# Patient Record
Sex: Female | Born: 1975 | Hispanic: No | Marital: Single | Smoking: Never smoker
Health system: Southern US, Community
[De-identification: ages and names within clinical notes are randomized; demographics above are authoritative.]

## PROBLEM LIST (undated history)

## (undated) DIAGNOSIS — M1990 Unspecified osteoarthritis, unspecified site: Secondary | ICD-10-CM

## (undated) DIAGNOSIS — J45909 Unspecified asthma, uncomplicated: Secondary | ICD-10-CM

## (undated) HISTORY — DX: Unspecified asthma, uncomplicated: J45.909

---

## 2004-10-19 ENCOUNTER — Other Ambulatory Visit: Admission: RE | Admit: 2004-10-19 | Discharge: 2004-10-19 | Payer: Self-pay | Admitting: Obstetrics and Gynecology

## 2005-11-13 ENCOUNTER — Other Ambulatory Visit: Admission: RE | Admit: 2005-11-13 | Discharge: 2005-11-13 | Payer: Self-pay | Admitting: Obstetrics and Gynecology

## 2007-01-09 ENCOUNTER — Other Ambulatory Visit: Admission: RE | Admit: 2007-01-09 | Discharge: 2007-01-09 | Payer: Self-pay | Admitting: Obstetrics and Gynecology

## 2008-02-03 ENCOUNTER — Other Ambulatory Visit: Admission: RE | Admit: 2008-02-03 | Discharge: 2008-02-03 | Payer: Self-pay | Admitting: Obstetrics and Gynecology

## 2009-02-10 ENCOUNTER — Other Ambulatory Visit: Admission: RE | Admit: 2009-02-10 | Discharge: 2009-02-10 | Payer: Self-pay | Admitting: Obstetrics and Gynecology

## 2010-04-11 ENCOUNTER — Other Ambulatory Visit: Admission: RE | Admit: 2010-04-11 | Discharge: 2010-04-11 | Payer: Self-pay | Admitting: Obstetrics and Gynecology

## 2013-08-06 ENCOUNTER — Other Ambulatory Visit: Payer: Self-pay | Admitting: Nurse Practitioner

## 2013-08-06 ENCOUNTER — Other Ambulatory Visit (HOSPITAL_COMMUNITY)
Admission: RE | Admit: 2013-08-06 | Discharge: 2013-08-06 | Disposition: A | Discharge status: Home / Self Care | Payer: 59 | Source: Ambulatory Visit | Attending: Nurse Practitioner | Admitting: Nurse Practitioner

## 2013-08-06 DIAGNOSIS — Z1151 Encounter for screening for human papillomavirus (HPV): Secondary | ICD-10-CM | POA: Insufficient documentation

## 2013-08-06 DIAGNOSIS — Z01419 Encounter for gynecological examination (general) (routine) without abnormal findings: Secondary | ICD-10-CM | POA: Insufficient documentation

## 2014-04-17 ENCOUNTER — Encounter (HOSPITAL_COMMUNITY): Payer: Self-pay | Admitting: Emergency Medicine

## 2014-04-17 ENCOUNTER — Emergency Department (HOSPITAL_COMMUNITY): Payer: 59

## 2014-04-17 ENCOUNTER — Emergency Department (HOSPITAL_COMMUNITY)
Admission: EM | Admit: 2014-04-17 | Discharge: 2014-04-18 | Disposition: A | Discharge status: Home / Self Care | Payer: 59 | Attending: Emergency Medicine | Admitting: Emergency Medicine

## 2014-04-17 DIAGNOSIS — Z880 Allergy status to penicillin: Secondary | ICD-10-CM | POA: Insufficient documentation

## 2014-04-17 DIAGNOSIS — Y92009 Unspecified place in unspecified non-institutional (private) residence as the place of occurrence of the external cause: Secondary | ICD-10-CM | POA: Diagnosis not present

## 2014-04-17 DIAGNOSIS — S4980XA Other specified injuries of shoulder and upper arm, unspecified arm, initial encounter: Secondary | ICD-10-CM | POA: Diagnosis present

## 2014-04-17 DIAGNOSIS — S59919A Unspecified injury of unspecified forearm, initial encounter: Secondary | ICD-10-CM

## 2014-04-17 DIAGNOSIS — S6990XA Unspecified injury of unspecified wrist, hand and finger(s), initial encounter: Secondary | ICD-10-CM

## 2014-04-17 DIAGNOSIS — Z79899 Other long term (current) drug therapy: Secondary | ICD-10-CM | POA: Diagnosis not present

## 2014-04-17 DIAGNOSIS — M129 Arthropathy, unspecified: Secondary | ICD-10-CM | POA: Insufficient documentation

## 2014-04-17 DIAGNOSIS — S59909A Unspecified injury of unspecified elbow, initial encounter: Secondary | ICD-10-CM | POA: Diagnosis not present

## 2014-04-17 DIAGNOSIS — S52123A Displaced fracture of head of unspecified radius, initial encounter for closed fracture: Secondary | ICD-10-CM | POA: Insufficient documentation

## 2014-04-17 DIAGNOSIS — W010XXA Fall on same level from slipping, tripping and stumbling without subsequent striking against object, initial encounter: Secondary | ICD-10-CM | POA: Insufficient documentation

## 2014-04-17 DIAGNOSIS — S46909A Unspecified injury of unspecified muscle, fascia and tendon at shoulder and upper arm level, unspecified arm, initial encounter: Secondary | ICD-10-CM | POA: Insufficient documentation

## 2014-04-17 DIAGNOSIS — Y9389 Activity, other specified: Secondary | ICD-10-CM | POA: Insufficient documentation

## 2014-04-17 DIAGNOSIS — S52121A Displaced fracture of head of right radius, initial encounter for closed fracture: Secondary | ICD-10-CM

## 2014-04-17 HISTORY — DX: Unspecified osteoarthritis, unspecified site: M19.90

## 2014-04-17 MED ORDER — HYDROCODONE-ACETAMINOPHEN 5-325 MG PO TABS: 1.0000 | ORAL_TABLET | Freq: Four times a day (QID) | ORAL | Status: DC | PRN

## 2014-04-17 MED ORDER — ONDANSETRON HCL 4 MG PO TABS: 4.0000 mg | ORAL_TABLET | Freq: Four times a day (QID) | ORAL | Status: DC

## 2014-04-17 MED ORDER — HYDROCODONE-ACETAMINOPHEN 5-325 MG PO TABS
2.0000 | ORAL_TABLET | Freq: Once | ORAL | Status: AC
  Administered 2014-04-17: 2 via ORAL
  Filled 2014-04-17: qty 2

## 2014-04-17 NOTE — Discharge Instructions (Signed)
Radial Head Fracture °A radial head fracture is a break of the smaller bone (radius) in the forearm. The head of this bone is the part near the elbow. These fractures commonly happen during a fall, when you land on an outstretched arm. These fractures are more common in middle aged adults and are common with a dislocation of the elbow. °SYMPTOMS  °· Swelling of the elbow joint and pain on the outside of the elbow. °· Pain and difficulty in bending or straightening the elbow. °· Pain and difficulty in turning the palm of the hand up or down with the elbow bent. °DIAGNOSIS  °Your caregiver may make this diagnosis by a physical exam. X-rays can confirm the type and amount of fracture. Sometimes a fracture that is not displaced cannot be seen on the original X-ray. °TREATMENT  °Radial head fractures are classified according to the amount of movement (displacement) of parts from the normal position.  °Type 1 Fractures °· Type 1 fractures are generally small fractures in which bone pieces remain together (nondisplaced fracture). °· The fracture may not be seen on initial X-rays. Usually if X-rays are repeated two to three weeks later, the fracture will show up. A splint or sling is used for a few days. Gentle early motion is used to prevent the elbow from becoming stiff. It should not be done vigorously or forced as this could displace the bone pieces. °Type 2 Fractures °· With type 2 fractures, bone pieces are slightly displaced and larger pieces of bone are broken off. °· If only a little displacement of the bone piece is present, splinting for 4 to 5 days usually works well. This is again followed with gentle active range of motion. Small fragments may be surgically removed. °· Large pieces of bone that can be put back into place will sometimes be fixed with pins or screws to hold them until the bone is healed. If this cannot be done, the fragments are removed. For older, less active people, sometimes the entire radial  head is removed if the wrist is not injured. The elbow and arm will still work fine. Soft tissue, tendon, and ligament injuries are corrected at the same time. °Type 3 Fractures °· Type 3 fractures have multiple broken pieces of bone that cannot be fixed. Surgery is usually needed to remove the broken bits of bone and what is left of the radial head. Soft-tissue damage is repaired. Gentle early motion is used to prevent the elbow from becoming stiff. Sometimes an artificial radial head can be used to prevent deformity if the elbow is unstable. °Rest, ice, elevation, immobilization, medications, and pain control are used in the early care. °HOME CARE INSTRUCTIONS  °· Keep the injured part elevated while sitting or lying down. Keep the injury above the level of your heart (the center of the chest). This will decrease swelling and pain. °· Apply ice to the injury for 15-20 minutes, 03-04 times per day while awake, for 2 days. Put the ice in a plastic bag and place a towel between the bag of ice and your cast or splint. °· Move your fingers to avoid stiffness and minimize swelling. °· If you have a plaster or fiberglass cast: °¨ Do not try to scratch the skin under the cast using sharp or pointed objects. °¨ Check the skin around the cast every day. You may put lotion on any red or sore areas. °¨ Keep your cast dry and clean. °· If you have a plaster splint: °¨   Wear the splint as directed. °¨ You may loosen the elastic around the splint if your fingers become numb, tingle, or turn cold or blue. °· Do not put pressure on any part of your cast or splint. It may break. Rest your cast only on a pillow for the first 24 hours until it is fully hardened. °· Your cast or splint can be protected during bathing with a plastic bag. Do not lower the cast or splint into the water. °· Only take over-the-counter or prescription medicines for pain, discomfort, or fever as directed by your caregiver. °· Follow all instructions for  follow-up with your caregiver. This includes any orthopedic referrals, physical therapy, and rehabilitation. Any delay in obtaining necessary care could result in a delay or failure of the bones to heal or permanent elbow stiffness. °· Do not overdo exercises. This could further damage your injury. °SEEK IMMEDIATE MEDICAL CARE IF:  °· Your cast or splint gets damaged or breaks. °· You have more severe pain or swelling than you did before getting the cast. °· You have severe pain when stretching your fingers. °· There is a bad smell, new stains, and/or pus-like (purulent) drainage coming from under the cast. °· Your fingers or hand turn pale or blue, become cold, or you lose feeling. °Document Released: 05/28/2006 Document Revised: 12/21/2013 Document Reviewed: 07/05/2009 °[employer]® Patient Information ©2015 [employer]. This information is not intended to replace advice given to you by your health care provider. Make sure you discuss any questions you have with your health care provider. ° °

## 2014-04-17 NOTE — ED Notes (Signed)
Pt arrived to the ED with a complaint of right arm pain.  Pt was attempting to step over an dog gate when she caught her foot and fell down.  Pt states the pain is located knees and wrist.  Pt has taken an ibuprofen and applied ice.

## 2014-04-17 NOTE — ED Provider Notes (Signed)
CSN: [id number]     Arrival date & time 04/17/14  2114 History   First MD Initiated Contact with Patient 04/17/14 2235     Chief Complaint  Patient presents with  . Arm Pain     (Consider location/radiation/quality/duration/timing/severity/associated sxs/prior Treatment) HPI Comments: Patient presents to the emergency department with chief complaint of right wrist and right elbow pain. She states that she tripped over a dog gate at her house today. She reports moderate to severe pain. It is worsened with movement. She has not taken anything to alleviate her symptoms. She also complains of pain on her right knee, but is able to ambulate, and states that she thinks she is just bruised.  The history is provided by the patient. No language interpreter was used.    Past Medical History  Diagnosis Date  . Arthritis    History reviewed. No pertinent past surgical history. History reviewed. No pertinent family history. History  Substance Use Topics  . Smoking status: Never Smoker   . Smokeless tobacco: Not on file  . Alcohol Use: No   OB History   Grav Para Term Preterm Abortions TAB SAB Ect Mult Living                 Review of Systems  Constitutional: Negative for fever and chills.  Respiratory: Negative for shortness of breath.   Cardiovascular: Negative for chest pain.  Gastrointestinal: Negative for nausea, vomiting, diarrhea and constipation.  Genitourinary: Negative for dysuria.  Musculoskeletal: Positive for arthralgias and joint swelling.      Allergies  Cephalosporins; Erythromycin; and Penicillins  Home Medications   Prior to Admission medications   Medication Sig Start Date End Date Taking? Authorizing Provider  albuterol (PROVENTIL HFA;VENTOLIN HFA) 108 (90 BASE) MCG/ACT inhaler Inhale 2 puffs into the lungs every 4 (four) hours as needed for wheezing or shortness of breath.   Yes Historical Provider, MD  ibuprofen (ADVIL,MOTRIN) 200 MG tablet Take 200 mg by  mouth once as needed.   Yes Historical Provider, MD  Norgestimate-Ethinyl Estradiol Triphasic (TRINESSA, 28,) 0.18/0.215/0.25 MG-35 MCG tablet Take 1 tablet by mouth daily.   Yes Historical Provider, MD   BP 143/84  Pulse 101  Temp(Src) 98.1 F (36.7 C) (Oral)  Resp 16  SpO2 98%  LMP 04/03/2014 Physical Exam  Nursing note and vitals reviewed. Constitutional: She is oriented to person, place, and time. She appears well-developed and well-nourished.  HENT:  Head: Normocephalic and atraumatic.  Eyes: Conjunctivae and EOM are normal.  Neck: Normal range of motion.  Cardiovascular: Normal rate.   Intact distal pulses  Pulmonary/Chest: Effort normal.  Abdominal: She exhibits no distension.  Musculoskeletal: Normal range of motion.  Right wrist and elbow range of motion and strength limited secondary to pain, no bony abnormality or deformity  Neurological: She is alert and oriented to person, place, and time.  Sensation intact  Skin: Skin is dry.  Psychiatric: She has a normal mood and affect. Her behavior is normal. Judgment and thought content normal.    ED Course  Procedures (including critical care time) Labs Review Labs Reviewed - No data to display  Imaging Review Dg Elbow Complete Right  04/17/2014   CLINICAL DATA:  Fall.  Pain.  EXAM: RIGHT ELBOW - COMPLETE 3+ VIEW  COMPARISON:  None.  FINDINGS: A joint effusion is present. There is a deformity of the radial neck suggesting fracture. No other acute fractures are present. The joint is located.  IMPRESSION: 1. Small joint effusion.  2. Deformity at the radial neck compatible with fracture.   Electronically Signed   By: Gennette Pac M.D.   On: 04/17/2014 22:32   Dg Wrist Complete Right  04/17/2014   CLINICAL DATA:  Fall.  Pain.  EXAM: RIGHT WRIST - COMPLETE 3+ VIEW  COMPARISON:  None.  FINDINGS: Right wrist is located. No acute bone or soft tissue abnormalities are present. The carpal bones are intact.  IMPRESSION: Negative  right wrist radiographs.   Electronically Signed   By: Gennette Pac M.D.   On: 04/17/2014 22:30     EKG Interpretation None      MDM   Final diagnoses:  Radial head fracture, closed, right, initial encounter   Patient with fracture of the radial neck, will splint, treat pain, and discharge with orthopedic followup.     Roxy Horseman, PA-C 04/17/14 2344

## 2014-04-20 NOTE — ED Provider Notes (Signed)
Medical screening examination/treatment/procedure(s) were performed by non-physician practitioner and as supervising physician I was immediately available for consultation/collaboration.  Sanela Evola T Trixy Loyola, MD 04/20/14 1732 

## 2016-11-27 ENCOUNTER — Other Ambulatory Visit: Payer: Self-pay | Admitting: Nurse Practitioner

## 2016-11-27 ENCOUNTER — Other Ambulatory Visit (HOSPITAL_COMMUNITY)
Admission: RE | Admit: 2016-11-27 | Discharge: 2016-11-27 | Disposition: A | Discharge status: Home / Self Care | Payer: 59 | Source: Ambulatory Visit | Attending: Nurse Practitioner | Admitting: Nurse Practitioner

## 2016-11-27 DIAGNOSIS — Z1151 Encounter for screening for human papillomavirus (HPV): Secondary | ICD-10-CM | POA: Diagnosis present

## 2016-11-27 DIAGNOSIS — Z01419 Encounter for gynecological examination (general) (routine) without abnormal findings: Secondary | ICD-10-CM | POA: Insufficient documentation

## 2016-12-14 LAB — CYTOLOGY - PAP
DIAGNOSIS: NEGATIVE
HPV: NOT DETECTED

## 2020-06-10 ENCOUNTER — Other Ambulatory Visit: Payer: Self-pay | Admitting: Obstetrics and Gynecology

## 2020-06-10 DIAGNOSIS — Z1231 Encounter for screening mammogram for malignant neoplasm of breast: Secondary | ICD-10-CM

## 2020-07-19 ENCOUNTER — Ambulatory Visit
Admission: RE | Admit: 2020-07-19 | Discharge: 2020-07-19 | Disposition: A | Discharge status: Home / Self Care | Payer: 59 | Source: Ambulatory Visit | Attending: Obstetrics and Gynecology | Admitting: Obstetrics and Gynecology

## 2020-07-19 ENCOUNTER — Other Ambulatory Visit: Payer: Self-pay | Admitting: Obstetrics and Gynecology

## 2020-07-19 ENCOUNTER — Other Ambulatory Visit: Payer: Self-pay

## 2020-07-19 DIAGNOSIS — Z1231 Encounter for screening mammogram for malignant neoplasm of breast: Secondary | ICD-10-CM

## 2020-07-20 ENCOUNTER — Other Ambulatory Visit: Payer: Self-pay | Admitting: Obstetrics and Gynecology

## 2020-07-20 DIAGNOSIS — R928 Other abnormal and inconclusive findings on diagnostic imaging of breast: Secondary | ICD-10-CM

## 2020-07-23 ENCOUNTER — Other Ambulatory Visit: Payer: Self-pay

## 2020-07-23 ENCOUNTER — Ambulatory Visit
Admission: RE | Admit: 2020-07-23 | Discharge: 2020-07-23 | Disposition: A | Discharge status: Home / Self Care | Payer: 59 | Source: Ambulatory Visit | Attending: Obstetrics and Gynecology | Admitting: Obstetrics and Gynecology

## 2020-07-23 DIAGNOSIS — R928 Other abnormal and inconclusive findings on diagnostic imaging of breast: Secondary | ICD-10-CM

## 2020-11-16 ENCOUNTER — Emergency Department (HOSPITAL_BASED_OUTPATIENT_CLINIC_OR_DEPARTMENT_OTHER): Payer: 59

## 2020-11-16 ENCOUNTER — Emergency Department (HOSPITAL_BASED_OUTPATIENT_CLINIC_OR_DEPARTMENT_OTHER)
Admission: EM | Admit: 2020-11-16 | Discharge: 2020-11-16 | Disposition: A | Discharge status: Home / Self Care | Payer: 59 | Attending: Emergency Medicine | Admitting: Emergency Medicine

## 2020-11-16 ENCOUNTER — Other Ambulatory Visit: Payer: Self-pay

## 2020-11-16 ENCOUNTER — Encounter (HOSPITAL_BASED_OUTPATIENT_CLINIC_OR_DEPARTMENT_OTHER): Payer: Self-pay

## 2020-11-16 DIAGNOSIS — M79662 Pain in left lower leg: Secondary | ICD-10-CM | POA: Diagnosis not present

## 2020-11-16 DIAGNOSIS — R52 Pain, unspecified: Secondary | ICD-10-CM

## 2020-11-16 LAB — D-DIMER, QUANTITATIVE: D-Dimer, Quant: <0.27 ug/mL-FEU (ref 0.00–0.50)

## 2020-11-16 LAB — CBC WITH DIFFERENTIAL/PLATELET
Abs Immature Granulocytes: 0.02 10*3/uL (ref 0.00–0.07)
Basophils Absolute: 0 10*3/uL (ref 0.0–0.1)
Basophils Relative: 1 %
Eosinophils Absolute: 0.2 10*3/uL (ref 0.0–0.5)
Eosinophils Relative: 3 %
HCT: 38.9 % (ref 36.0–46.0)
Hemoglobin: 13.4 g/dL (ref 12.0–15.0)
Immature Granulocytes: 0 %
Lymphocytes Relative: 36 %
Lymphs Abs: 2.5 10*3/uL (ref 0.7–4.0)
MCH: 29.8 pg (ref 26.0–34.0)
MCHC: 34.4 g/dL (ref 30.0–36.0)
MCV: 86.4 fL (ref 80.0–100.0)
Monocytes Absolute: 0.5 10*3/uL (ref 0.1–1.0)
Monocytes Relative: 7 %
Neutro Abs: 3.7 10*3/uL (ref 1.7–7.7)
Neutrophils Relative %: 53 %
Platelets: 307 10*3/uL (ref 150–400)
RBC: 4.5 MIL/uL (ref 3.87–5.11)
RDW: 12.6 % (ref 11.5–15.5)
WBC: 6.9 10*3/uL (ref 4.0–10.5)
nRBC: 0 % (ref 0.0–0.2)

## 2020-11-16 LAB — BASIC METABOLIC PANEL
Anion gap: 8 (ref 5–15)
BUN: 11 mg/dL (ref 6–20)
CO2: 21 mmol/L — ABNORMAL LOW (ref 22–32)
Calcium: 6.9 mg/dL — ABNORMAL LOW (ref 8.9–10.3)
Chloride: 110 mmol/L (ref 98–111)
Creatinine, Ser: 0.45 mg/dL (ref 0.44–1.00)
GFR, Estimated: >60 mL/min (ref >60)
Glucose, Bld: 86 mg/dL (ref 70–99)
Potassium: 3.4 mmol/L — ABNORMAL LOW (ref 3.5–5.1)
Sodium: 139 mmol/L (ref 135–145)

## 2020-11-16 LAB — HEPATIC FUNCTION PANEL
ALT: 14 U/L (ref 0–44)
AST: 17 U/L (ref 15–41)
Albumin: 4.5 g/dL (ref 3.5–5.0)
Alkaline Phosphatase: 37 U/L — ABNORMAL LOW (ref 38–126)
Bilirubin, Direct: 0.1 mg/dL (ref 0.0–0.2)
Indirect Bilirubin: 0.8 mg/dL (ref 0.3–0.9)
Total Bilirubin: 0.9 mg/dL (ref 0.3–1.2)
Total Protein: 7.3 g/dL (ref 6.5–8.1)

## 2020-11-16 LAB — VITAMIN D 25 HYDROXY (VIT D DEFICIENCY, FRACTURES): Vit D, 25-Hydroxy: 18.82 ng/mL — ABNORMAL LOW (ref 30–100)

## 2020-11-16 MED ORDER — CALCIUM GLUCONATE-NACL 1-0.675 GM/50ML-% IV SOLN
1.0000 g | Freq: Once | INTRAVENOUS | Status: AC
  Administered 2020-11-16: 1000 mg via INTRAVENOUS
  Filled 2020-11-16: qty 50

## 2020-11-16 MED ORDER — TRAMADOL HCL 50 MG PO TABS: 50.0000 mg | ORAL_TABLET | Freq: Four times a day (QID) | ORAL | 0 refills | Status: DC | PRN

## 2020-11-16 MED ORDER — CYCLOBENZAPRINE HCL 10 MG PO TABS
10.0000 mg | ORAL_TABLET | Freq: Once | ORAL | Status: AC
  Administered 2020-11-16: 10 mg via ORAL
  Filled 2020-11-16: qty 1

## 2020-11-16 MED ORDER — SODIUM CHLORIDE 0.9 % IV SOLN: 1.0000 g | Freq: Once | INTRAVENOUS | Status: DC

## 2020-11-16 MED ORDER — SODIUM CHLORIDE 0.9 % IV BOLUS
1000.0000 mL | Freq: Once | INTRAVENOUS | Status: AC
  Administered 2020-11-16: 1000 mL via INTRAVENOUS

## 2020-11-16 NOTE — ED Triage Notes (Signed)
Patient here POV from Home with Left Leg Pain.   Pain began shortly prior to Midnight tonight and felt like a Cramp; pain however has not subsided.   Ambulatory, G[facility] 15.

## 2020-11-16 NOTE — ED Provider Notes (Signed)
[facility] EMERGENCY DEPT Provider Note   CSN: [id number] Arrival date & time: 11/16/20  0200     History Chief Complaint  Patient presents with  . Leg Pain    Lower Left    ALIVEA GLADSON is a 45 y.o. female.  Patient is a 45 year old female with no significant past medical history.  She presents for evaluation of leg pain.  Patient was awakened from sleep several hours ago with a pain in her left calf.  She thought it was a cramp initially, however would not go away.  She describes a constant ache to the calf with no swelling.  She denies any injury or trauma.  She denies any recent travel or prolonged immobilization.  Patient denies any chest pain or difficulty breathing.  Pain is worse with ambulation and palpation.  The history is provided by the patient.  Leg Pain Lower extremity pain location: Left calf. Pain details:    Quality:  Cramping   Radiates to:  Does not radiate   Severity:  Moderate   Onset quality:  Sudden   Duration:  3 hours   Timing:  Constant   Progression:  Unchanged Chronicity:  New      Past Medical History:  Diagnosis Date  . Arthritis     There are no problems to display for this patient.   History reviewed. No pertinent surgical history.   OB History   No obstetric history on file.     Family History  Problem Relation Age of Onset  . Breast cancer Neg Hx     Social History   Tobacco Use  . Smoking status: Never Smoker  . Smokeless tobacco: Never Used  Substance Use Topics  . Alcohol use: No  . Drug use: No    Home Medications Prior to Admission medications   Medication Sig Start Date End Date Taking? Authorizing Provider  albuterol (PROVENTIL HFA;VENTOLIN HFA) 108 (90 BASE) MCG/ACT inhaler Inhale 2 puffs into the lungs every 4 (four) hours as needed for wheezing or shortness of breath.    [provider]  HYDROcodone-acetaminophen (NORCO/VICODIN) 5-325 MG per tablet Take 1-2 tablets by mouth every  6 (six) hours as needed for moderate pain or severe pain. 04/17/14   Roxy Horseman, PA-C  ibuprofen (ADVIL,MOTRIN) 200 MG tablet Take 200 mg by mouth once as needed.    [provider]  Norgestimate-Ethinyl Estradiol Triphasic (TRINESSA, 28,) 0.18/0.215/0.25 MG-35 MCG tablet Take 1 tablet by mouth daily.    [provider]  ondansetron (ZOFRAN) 4 MG tablet Take 1 tablet (4 mg total) by mouth every 6 (six) hours. 04/17/14   Roxy Horseman, PA-C    Allergies    Cephalosporins, Erythromycin, and Penicillins  Review of Systems   Review of Systems  All other systems reviewed and are negative.   Physical Exam Updated Vital Signs BP (!) 148/81 (BP Location: Right Arm)   Pulse 84   Temp 97.8 F (36.6 C) (Oral)   Resp 16   Ht 5\' 7"  (1.702 m)   Wt 68 kg   LMP 11/09/2020 (Within Days)   SpO2 100%   BMI 23.49 kg/m   Physical Exam Vitals and nursing note reviewed.  Constitutional:      General: She is not in acute distress.    Appearance: Normal appearance. She is not ill-appearing, toxic-appearing or diaphoretic.  HENT:     Head: Normocephalic and atraumatic.  Pulmonary:     Effort: Pulmonary effort is normal.  Musculoskeletal:  Comments: The left leg appears grossly normal.  There is some tenderness to the left calf.  Denna Haggard' sign is equivocal.  DP pulses are easily palpable and motor and sensation are intact throughout the entire foot.  Skin:    General: Skin is warm and dry.  Neurological:     Mental Status: She is alert.     ED Results / Procedures / Treatments   Labs (all labs ordered are listed, but only abnormal results are displayed) Labs Reviewed  BASIC METABOLIC PANEL  CBC WITH DIFFERENTIAL/PLATELET  D-DIMER, QUANTITATIVE    EKG None  Radiology No results found.  Procedures Procedures   Medications Ordered in ED Medications - No data to display  ED Course  I have reviewed the triage vital signs and the nursing  notes.  Pertinent labs & imaging results that were available during my care of the patient were reviewed by me and considered in my medical decision making (see chart for details).    MDM Rules/Calculators/A&P  Patient is a 45 year old female presenting with complaints of left calf pain.  This woke her from sleep and began in the absence of any injury or trauma.  She describes a cramp to the back of her left calf.  Her ultrasound is negative for DVT and D-dimer is negative.  She did return with a calcium level of 6.9, the significance of which I am uncertain.  Patient was given calcium gluconate and IV fluids.  Additional studies were added on including parathyroid hormone, ionized calcium, and vitamin D levels.  Patient to follow-up the results of these tests with her primary doctor.    Final Clinical Impression(s) / ED Diagnoses Final diagnoses:  None    Rx / DC Orders ED Discharge Orders    None       Geoffery Lyons, MD 11/16/20 [phone]

## 2020-11-16 NOTE — Discharge Instructions (Addendum)
Take ibuprofen 600 mg every 6 hours as needed for pain.  Take tramadol as prescribed as needed for pain not relieved with ibuprofen.  Take 2 Tums tablets twice daily for the next several days.  You should follow-up with your primary doctor regarding your low calcium level.  Return to the ER in the meantime if symptoms worsen or change.

## 2020-11-17 LAB — PARATHYROID HORMONE, INTACT (NO CA): PTH: 27 pg/mL (ref 15–65)

## 2020-11-17 LAB — CALCIUM, IONIZED: Calcium, Ionized, Serum: 4.9 mg/dL (ref 4.5–5.6)

## 2021-06-09 ENCOUNTER — Other Ambulatory Visit: Payer: Self-pay | Admitting: Obstetrics and Gynecology

## 2021-06-09 DIAGNOSIS — Z1231 Encounter for screening mammogram for malignant neoplasm of breast: Secondary | ICD-10-CM

## 2021-07-26 ENCOUNTER — Ambulatory Visit
Admission: RE | Admit: 2021-07-26 | Discharge: 2021-07-26 | Disposition: A | Discharge status: Home / Self Care | Payer: 59 | Source: Ambulatory Visit | Attending: Obstetrics and Gynecology | Admitting: Obstetrics and Gynecology

## 2021-07-26 DIAGNOSIS — Z1231 Encounter for screening mammogram for malignant neoplasm of breast: Secondary | ICD-10-CM

## 2022-06-22 ENCOUNTER — Other Ambulatory Visit: Payer: Self-pay | Admitting: Obstetrics and Gynecology

## 2022-06-22 DIAGNOSIS — Z1231 Encounter for screening mammogram for malignant neoplasm of breast: Secondary | ICD-10-CM

## 2022-07-11 ENCOUNTER — Other Ambulatory Visit: Payer: Self-pay

## 2022-07-11 ENCOUNTER — Ambulatory Visit
Admission: RE | Admit: 2022-07-11 | Discharge: 2022-07-11 | Disposition: A | Discharge status: Home / Self Care | Payer: 59 | Source: Ambulatory Visit | Attending: Emergency Medicine | Admitting: Emergency Medicine

## 2022-07-11 VITALS — BP 124/68 | HR 92 | Temp 97.7°F | Resp 18

## 2022-07-11 DIAGNOSIS — Z20822 Contact with and (suspected) exposure to covid-19: Secondary | ICD-10-CM | POA: Diagnosis present

## 2022-07-11 DIAGNOSIS — B349 Viral infection, unspecified: Secondary | ICD-10-CM | POA: Diagnosis not present

## 2022-07-11 DIAGNOSIS — Z20828 Contact with and (suspected) exposure to other viral communicable diseases: Secondary | ICD-10-CM | POA: Insufficient documentation

## 2022-07-11 LAB — RESP PANEL BY RT-PCR (RSV, FLU A&B, COVID)  RVPGX2
Influenza A by PCR: NEGATIVE
Influenza B by PCR: NEGATIVE
Resp Syncytial Virus by PCR: NEGATIVE
SARS Coronavirus 2 by RT PCR: POSITIVE — AB

## 2022-07-11 NOTE — ED Provider Notes (Signed)
[facility]    CSN: [id number] Arrival date & time: 07/11/22  1314    HISTORY   Chief Complaint  Patient presents with   Ear Fullness   Otalgia   Sore Throat   HPI Cassandra PolesKelly M Gonzales is a pleasant, 46 y.o. female with a history of mild intermittent asthma who presents to urgent care today. Exposure to COVID-19 and RSV at her job site, states exposure was a little over a week ago.  Today, patient complains of pain in her left ear, nasal congestion, rhinorrhea, nonproductive cough and a sore throat that began 4 days ago.  Patient states she has been using her inhaler, last dose was this morning, states she has not tried anything else to alleviate her symptoms.  Patient has normal vital signs on arrival today and appears to be in no acute distress.  Patient denies headache, sinus pain, sinus pressure, loss of taste or smell, body aches, chills, nausea, vomiting, diarrhea.  The history is provided by the patient.   Past Medical History:  Diagnosis Date   Arthritis    Asthma    There are no problems to display for this patient.  History reviewed. No pertinent surgical history. OB History   No obstetric history on file.    Home Medications    Prior to Admission medications   Medication Sig Start Date End Date Taking? Authorizing Provider  albuterol (PROVENTIL HFA;VENTOLIN HFA) 108 (90 BASE) MCG/ACT inhaler Inhale 2 puffs into the lungs every 4 (four) hours as needed for wheezing or shortness of breath.   Yes [provider]  Norgestimate-Ethinyl Estradiol Triphasic (TRINESSA, 28,) 0.18/0.215/0.25 MG-35 MCG tablet Take 1 tablet by mouth daily.   Yes [provider]    Family History Family History  Problem Relation Age of Onset   Hyperlipidemia Mother    Breast cancer Neg Hx    Social History Social History   Tobacco Use   Smoking status: Never   Smokeless tobacco: Never  Vaping Use   Vaping Use: Never used  Substance Use Topics   Alcohol use:  No   Drug use: No   Allergies   Cephalosporins, Erythromycin, and Penicillins  Review of Systems Review of Systems Pertinent findings revealed after performing a 14 point review of systems has been noted in the history of present illness.  Physical Exam Triage Vital Signs ED Triage Vitals  Enc Vitals Group     BP 06/16/21 0827 (!) 147/82     Pulse Rate 06/16/21 0827 72     Resp 06/16/21 0827 18     Temp 06/16/21 0827 98.3 F (36.8 C)     Temp Source 06/16/21 0827 Oral     SpO2 06/16/21 0827 98 %     Weight --      Height --      Head Circumference --      Peak Flow --      Pain Score 06/16/21 0826 5     Pain Loc --      Pain Edu? --      Excl. in GC? --   No data found.  Updated Vital Signs BP 124/68 (BP Location: Right Arm)   Pulse 92   Temp 97.7 F (36.5 C) (Oral)   Resp 18   LMP 06/12/2022 (Approximate)   SpO2 96%   Physical Exam Vitals and nursing note reviewed.  Constitutional:      General: She is not in acute distress.    Appearance: Normal  appearance. She is not ill-appearing.  HENT:     Head: Normocephalic and atraumatic.     Salivary Glands: Right salivary gland is not diffusely enlarged or tender. Left salivary gland is not diffusely enlarged or tender.     Right Ear: Tympanic membrane, ear canal and external ear normal. No drainage. No middle ear effusion. There is no impacted cerumen. Tympanic membrane is not erythematous or bulging.     Left Ear: Tympanic membrane, ear canal and external ear normal. No drainage.  No middle ear effusion. There is no impacted cerumen. Tympanic membrane is not erythematous or bulging.     Nose: Nose normal. No nasal deformity, septal deviation, mucosal edema, congestion or rhinorrhea.     Right Turbinates: Not enlarged, swollen or pale.     Left Turbinates: Not enlarged, swollen or pale.     Right Sinus: No maxillary sinus tenderness or frontal sinus tenderness.     Left Sinus: No maxillary sinus tenderness or frontal  sinus tenderness.     Mouth/Throat:     Lips: Pink. No lesions.     Mouth: Mucous membranes are moist. No oral lesions.     Pharynx: Oropharynx is clear. Uvula midline. Posterior oropharyngeal erythema present. No pharyngeal swelling, oropharyngeal exudate or uvula swelling.     Tonsils: No tonsillar exudate. 0 on the right. 0 on the left.  Eyes:     General: Lids are normal.        Right eye: No discharge.        Left eye: No discharge.     Extraocular Movements: Extraocular movements intact.     Conjunctiva/sclera: Conjunctivae normal.     Right eye: Right conjunctiva is not injected.     Left eye: Left conjunctiva is not injected.  Neck:     Trachea: Trachea and phonation normal.  Cardiovascular:     Rate and Rhythm: Normal rate and regular rhythm.     Pulses: Normal pulses.     Heart sounds: Normal heart sounds. No murmur heard.    No friction rub. No gallop.  Pulmonary:     Effort: Pulmonary effort is normal. No tachypnea, bradypnea, accessory muscle usage, prolonged expiration, respiratory distress or retractions.     Breath sounds: Normal breath sounds and air entry. No stridor, decreased air movement or transmitted upper airway sounds. No decreased breath sounds, wheezing, rhonchi or rales.  Chest:     Chest wall: No tenderness.  Musculoskeletal:        General: Normal range of motion.     Cervical back: Normal range of motion and neck supple. Normal range of motion.  Lymphadenopathy:     Head:     Right side of head: Submental, submandibular and tonsillar adenopathy present.     Left side of head: Submental, submandibular and tonsillar adenopathy present.     Cervical: Cervical adenopathy present.     Right cervical: Superficial cervical adenopathy, deep cervical adenopathy and posterior cervical adenopathy present.     Left cervical: Superficial cervical adenopathy, deep cervical adenopathy and posterior cervical adenopathy present.  Skin:    General: Skin is warm and  dry.     Findings: No erythema or rash.  Neurological:     General: No focal deficit present.     Mental Status: She is alert and oriented to person, place, and time.  Psychiatric:        Mood and Affect: Mood normal.        Behavior: Behavior normal.  Visual Acuity Right Eye Distance:   Left Eye Distance:   Bilateral Distance:    Right Eye Near:   Left Eye Near:    Bilateral Near:     [facility] / Diagnostics / Procedures:     Radiology No results found.  Procedures Procedures (including critical care time) EKG  Pending results:  Labs Reviewed  RESP PANEL BY RT-PCR (RSV, FLU A&B, COVID)  [id number]    Medications Ordered in UC: Medications - No data to display  UC Diagnoses / Final Clinical Impressions(s)   I have reviewed the triage vital signs and the nursing notes.  Pertinent labs & imaging results that were available during my care of the patient were reviewed by me and considered in my medical decision making (see chart for details).    Final diagnoses:  Exposure to COVID-19 virus  Exposure to respiratory syncytial virus (RSV)  Viral illness   Patient was tested for COVID-19, influenza and RSV, will notify patient of results once received.  Due to duration of symptoms, patient would not benefit from influenza antiviral treatment and likely does not qualify for COVID-19 antiviral treatment due to lack of comorbidities increasing her risk of hospitalization.  Patient provided with information about self-care at home for viral respiratory illnesses.  Return precautions advised.  ED Prescriptions   None    PDMP not reviewed this encounter.  Disposition Upon Discharge:  Condition: stable for discharge home Home: take medications as prescribed; routine discharge instructions as discussed; follow up as advised.  Patient presented with an acute illness with associated systemic symptoms and significant discomfort requiring urgent management. In my opinion, this  is a condition that a prudent lay person (someone who possesses an average knowledge of health and medicine) may potentially expect to result in complications if not addressed urgently such as respiratory distress, impairment of bodily function or dysfunction of bodily organs.   Routine symptom specific, illness specific and/or disease specific instructions were discussed with the patient and/or caregiver at length.   As such, the patient has been evaluated and assessed, work-up was performed and treatment was provided in alignment with urgent care protocols and evidence based medicine.  Patient/parent/caregiver has been advised that the patient may require follow up for further testing and treatment if the symptoms continue in spite of treatment, as clinically indicated and appropriate.  If the patient was tested for COVID-19, Influenza and/or RSV, then the patient/parent/guardian was advised to isolate at home pending the results of his/her diagnostic coronavirus test and potentially longer if they're positive. I have also advised pt that if his/her COVID-19 test returns positive, it's recommended to self-isolate for at least 10 days after symptoms first appeared AND until fever-free for 24 hours without fever reducer AND other symptoms have improved or resolved. Discussed self-isolation recommendations as well as instructions for household member/close contacts as per the [facility] and [place] [facility], and also gave patient the COVID packet with this information.  Patient/parent/caregiver has been advised to return to the [facility] or PCP in 3-5 days if no better; to PCP or the Emergency Department if new signs and symptoms develop, or if the current signs or symptoms continue to change or worsen for further workup, evaluation and treatment as clinically indicated and appropriate  The patient will follow up with their current PCP if and as advised. If the patient does not currently have a PCP we will assist them in  obtaining one.   The patient may need specialty follow up if  the symptoms continue, in spite of conservative treatment and management, for further workup, evaluation, consultation and treatment as clinically indicated and appropriate.  Patient/parent/caregiver verbalized understanding and agreement of plan as discussed.  All questions were addressed during visit.  Please see discharge instructions below for further details of plan.  Discharge Instructions:   Discharge Instructions      You received a COVID-19, RSV and influenza PCR test today.  The results of your PCR testing will be posted to your MyChart once it is complete.  This will actually only take 6 to 12 hours, not more than 24 hours, as I originally thought.    If any of your results are positive, you will be contacted by phone.  Please discuss with the callback nurse whether or not you would benefit from antiviral therapy treatment for COVID-19 or influenza.  There is no treatment for RSV.  I have enclosed information about COVID-19 and viral respiratory infections and how to care for yourself at home.  I hope you find these helpful.    Based on my physical exam findings and the history you have provided  today, I do not recommend antibiotics at this time.  I do not believe the risks and side effects of antibiotics would outweigh any minimal benefit that they might provide.         Please read below to learn more about the medications, dosages and frequencies that I recommend to help alleviate your symptoms and to get you feeling better soon:   Advil, Motrin (ibuprofen): This is a good anti-inflammatory medication which addresses aches, pains and inflammation of the upper airways that causes sinus and nasal congestion as well as in the lower airways which makes your cough feel tight and sometimes burn.  I recommend that you take between 400 to 600 mg every 6-8 hours as needed.      Tylenol (acetaminophen): This is a good fever  reducer.  If your body temperature rises above 101.5 as measured with a thermometer, it is recommended that you take 1,000 mg every 8 hours until your temperature falls below 101.5, please not take more than 3,000 mg of acetaminophen either as a separate medication or as in ingredient in an over-the-counter cold/flu preparation within a 24-hour period.      Sudafed (pseudoephedrine): This is a decongestant.  This medication has to be purchased from the pharmacist counter, I recommend taking 2 tablets, 60 mg, 2-3 times a day as needed to relieve runny nose and sinus drainage.  This medication is available over-the-counter however I have sent a prescription for your convenience.   Robitussin, Mucinex (guaifenesin): This is an expectorant.  This helps break up chest congestion and loosen up thick nasal drainage making phlegm and drainage more liquid and therefore easier to remove.  I recommend being 400 mg three times daily as needed.      Dextromethorphan (any cough medicine with the letters "DM" added to it's name such as Robitussin DM): This is a cough suppressant.  This is often recommended to be taken at nighttime to suppress cough and help children sleep.  Give dosage as directed on the bottle.  This medication is available over-the-counter.   Chloraseptic Throat Spray: Spray 5 sprays into affected area every 2 hours, hold for 15 seconds and either swallow or spit it out.  This is a excellent numbing medication because it is a spray, you can put it right where you needed and so sucking on a lozenge and numbing  your entire mouth.      Please follow-up within the next 5-7 days either with your primary care provider or urgent care if your symptoms do not resolve.  If you do not have a primary care provider, we will assist you in finding one.        Thank you for visiting urgent care today.  We appreciate the opportunity to participate in your care.         This office note has been dictated  using Teaching laboratory technician.  Unfortunately, this method of dictation can sometimes lead to typographical or grammatical errors.  I apologize for your inconvenience in advance if this occurs.  Please do not hesitate to reach out to me if clarification is needed.      Theadora Rama Scales, PA-C 07/11/22 1433

## 2022-07-11 NOTE — Discharge Instructions (Signed)
You received a COVID-19, RSV and influenza PCR test today.  The results of your PCR testing will be posted to your MyChart once it is complete.  This will actually only take 6 to 12 hours, not more than 24 hours, as I originally thought.    If any of your results are positive, you will be contacted by phone.  Please discuss with the callback nurse whether or not you would benefit from antiviral therapy treatment for COVID-19 or influenza.  There is no treatment for RSV.  I have enclosed information about COVID-19 and viral respiratory infections and how to care for yourself at home.  I hope you find these helpful.    Based on my physical exam findings and the history you have provided  today, I do not recommend antibiotics at this time.  I do not believe the risks and side effects of antibiotics would outweigh any minimal benefit that they might provide.         Please read below to learn more about the medications, dosages and frequencies that I recommend to help alleviate your symptoms and to get you feeling better soon:   Advil, Motrin (ibuprofen): This is a good anti-inflammatory medication which addresses aches, pains and inflammation of the upper airways that causes sinus and nasal congestion as well as in the lower airways which makes your cough feel tight and sometimes burn.  I recommend that you take between 400 to 600 mg every 6-8 hours as needed.      Tylenol (acetaminophen): This is a good fever reducer.  If your body temperature rises above 101.5 as measured with a thermometer, it is recommended that you take 1,000 mg every 8 hours until your temperature falls below 101.5, please not take more than 3,000 mg of acetaminophen either as a separate medication or as in ingredient in an over-the-counter cold/flu preparation within a 24-hour period.      Sudafed (pseudoephedrine): This is a decongestant.  This medication has to be purchased from the pharmacist counter, I recommend taking 2  tablets, 60 mg, 2-3 times a day as needed to relieve runny nose and sinus drainage.  This medication is available over-the-counter however I have sent a prescription for your convenience.   Robitussin, Mucinex (guaifenesin): This is an expectorant.  This helps break up chest congestion and loosen up thick nasal drainage making phlegm and drainage more liquid and therefore easier to remove.  I recommend being 400 mg three times daily as needed.      Dextromethorphan (any cough medicine with the letters "DM" added to it's name such as Robitussin DM): This is a cough suppressant.  This is often recommended to be taken at nighttime to suppress cough and help children sleep.  Give dosage as directed on the bottle.  This medication is available over-the-counter.   Chloraseptic Throat Spray: Spray 5 sprays into affected area every 2 hours, hold for 15 seconds and either swallow or spit it out.  This is a excellent numbing medication because it is a spray, you can put it right where you needed and so sucking on a lozenge and numbing your entire mouth.      Please follow-up within the next 5-7 days either with your primary care provider or urgent care if your symptoms do not resolve.  If you do not have a primary care provider, we will assist you in finding one.        Thank you for visiting urgent care today.  We appreciate the opportunity to participate in your care.

## 2022-07-11 NOTE — ED Triage Notes (Signed)
Left ear pain, nasal congestion, dry cough, and sore throat onset Sunday. Exposure to Covid and rsv at work. Home medicine: inhaler, last dose this morning and OTC cough drops. Hx Asthma

## 2022-08-22 ENCOUNTER — Ambulatory Visit
Admission: RE | Admit: 2022-08-22 | Discharge: 2022-08-22 | Disposition: A | Discharge status: Home / Self Care | Payer: 59 | Source: Ambulatory Visit | Attending: Obstetrics and Gynecology | Admitting: Obstetrics and Gynecology

## 2022-08-22 DIAGNOSIS — Z1231 Encounter for screening mammogram for malignant neoplasm of breast: Secondary | ICD-10-CM

## 2023-07-15 IMAGING — MG MM DIGITAL SCREENING BILAT W/ TOMO AND CAD
6 of 10 series · 6 of 30 positions shown · non-contrast
Comparison: Previous exam(s).

CLINICAL DATA: Screening.

EXAM:
DIGITAL SCREENING BILATERAL MAMMOGRAM WITH TOMOSYNTHESIS AND CAD
TECHNIQUE: Bilateral screening digital craniocaudal and mediolateral oblique
mammograms were obtained. Bilateral screening digital breast
tomosynthesis was performed. The images were evaluated with
computer-aided detection.

[L MLO synth-2D]
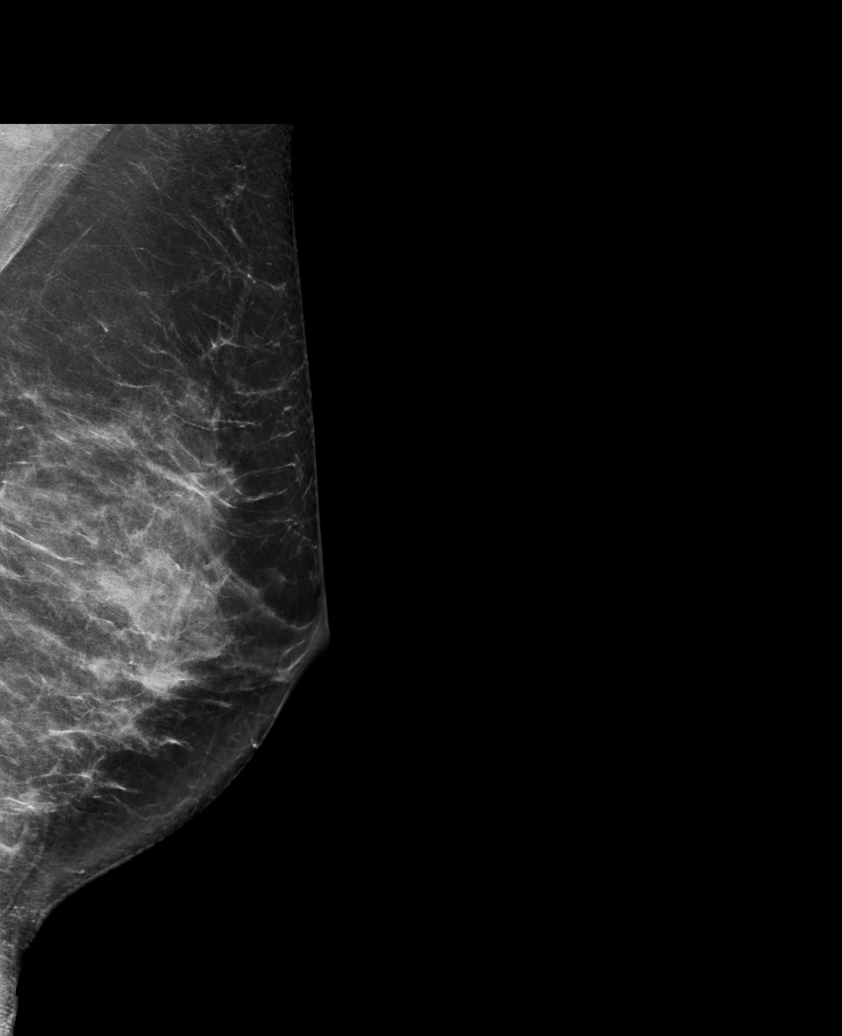

[L CC synth-2D (1 of 2)]
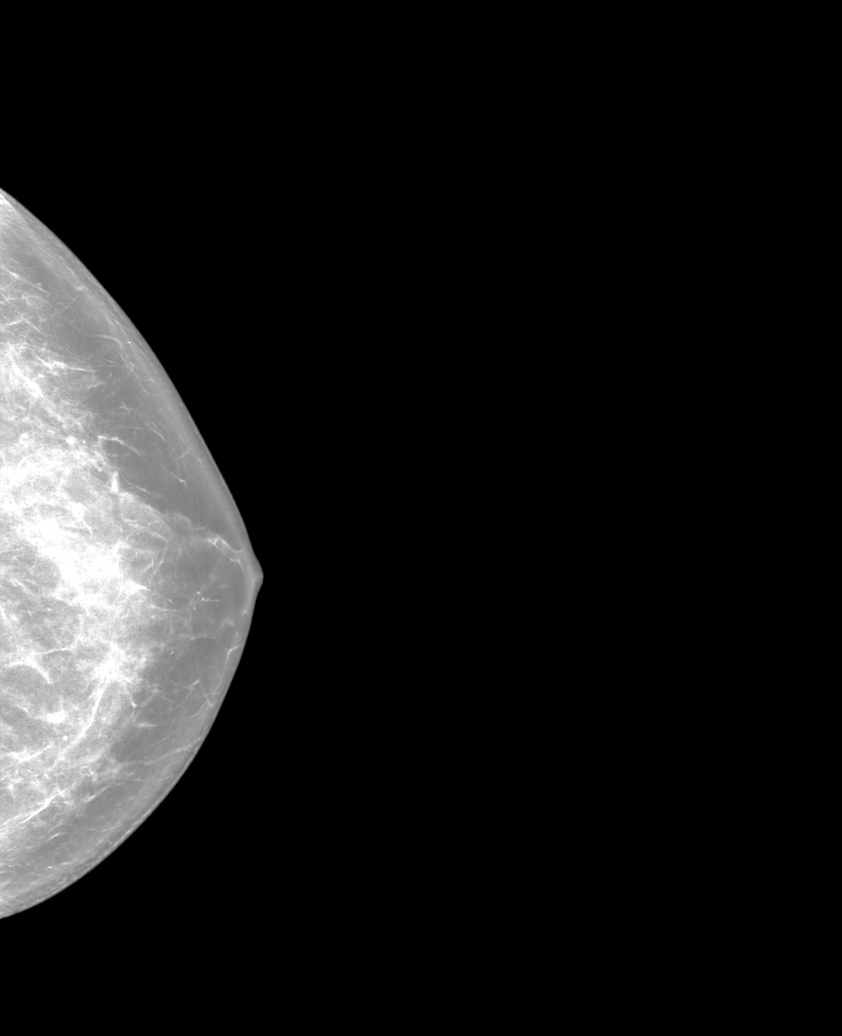

[L CC synth-2D (2 of 2)]
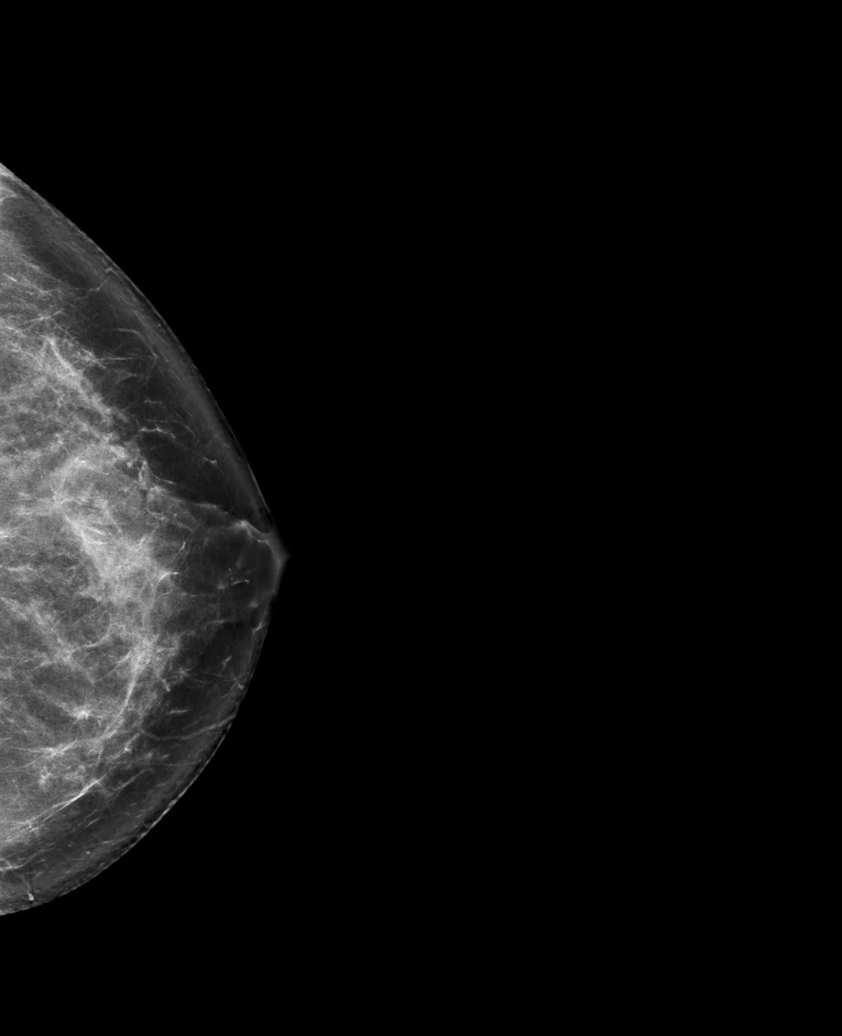

[R MLO synth-2D]
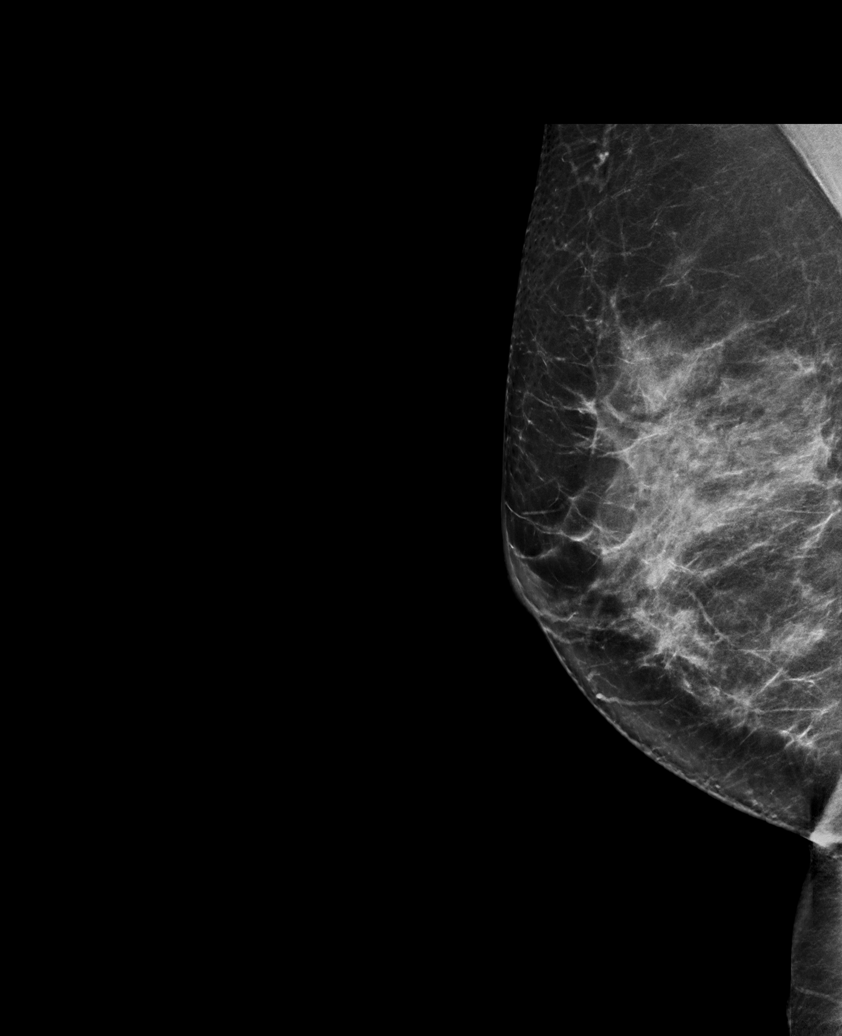

[R CC synth-2D]
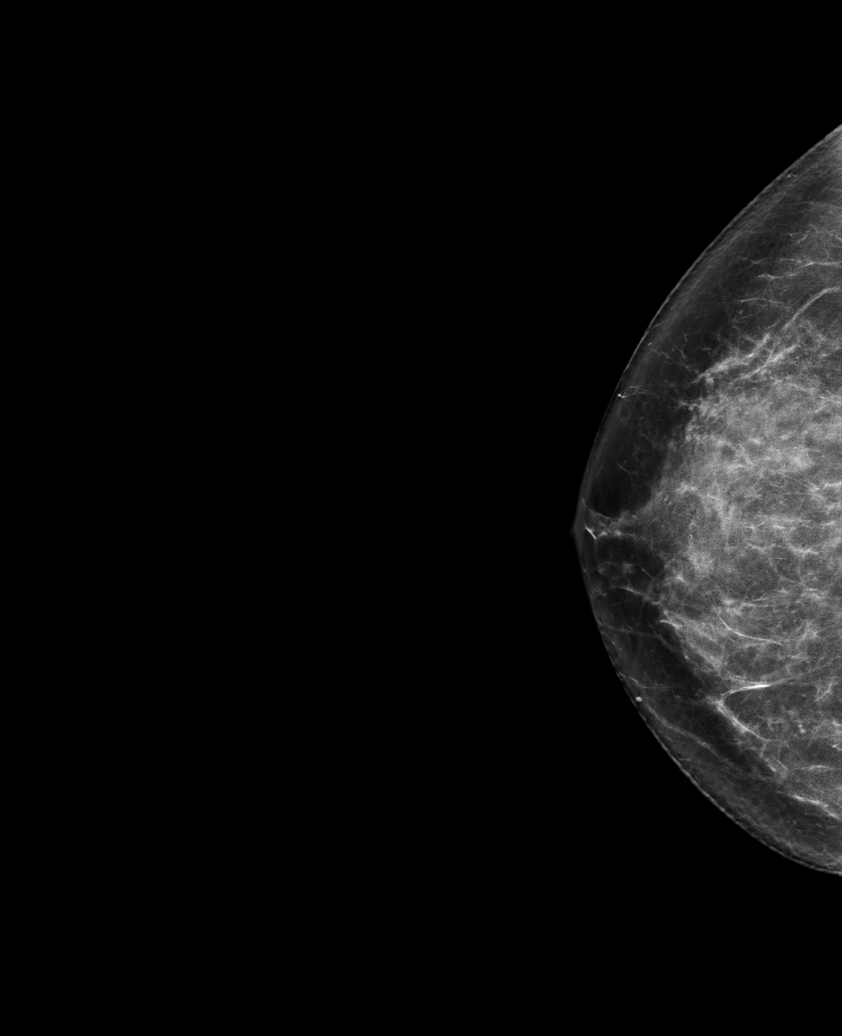

[L CC tomo · tomo slice 43/85.0]
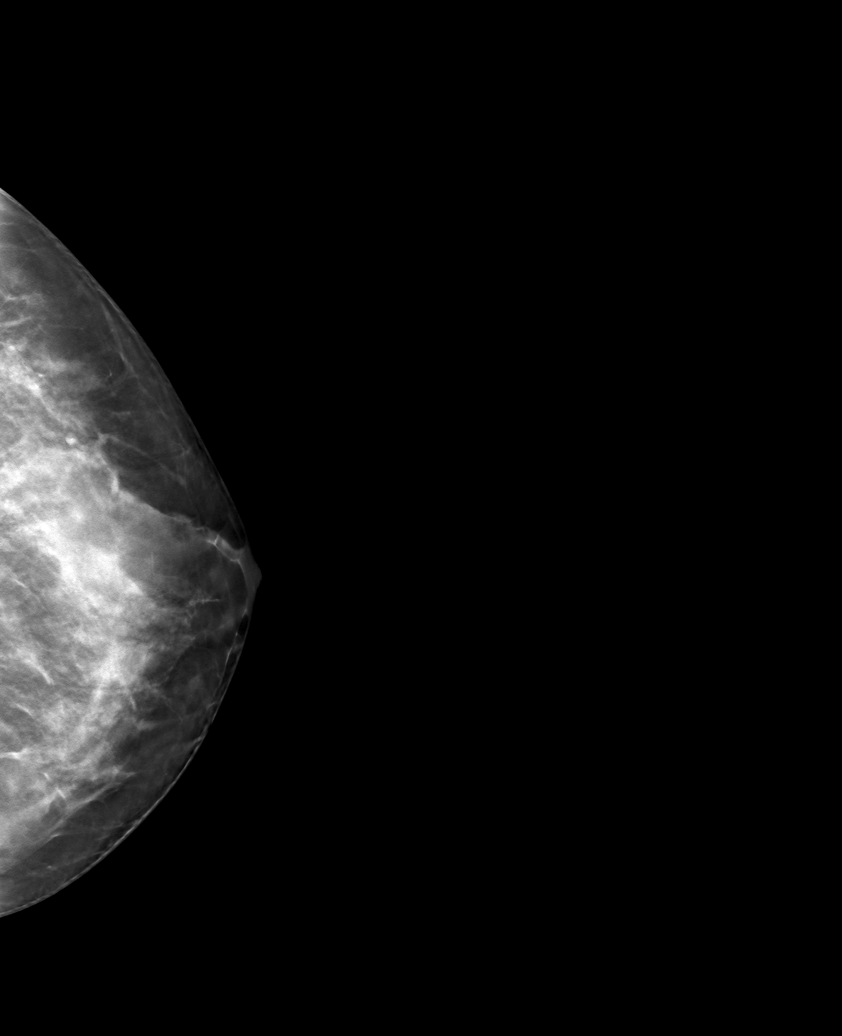

[6 of 30 positions shown; findings below may reference images not displayed]

ACR Breast Density Category c: The breast tissue is heterogeneously
dense, which may obscure small masses.
FINDINGS: There are no findings suspicious for malignancy.
IMPRESSION: No mammographic evidence of malignancy. A result letter of this
screening mammogram will be mailed directly to the patient.

RECOMMENDATION:
Screening mammogram in one year. (Code:Q3-W-BC3)

BI-RADS CATEGORY  1: Negative.

## 2023-07-16 ENCOUNTER — Other Ambulatory Visit: Payer: Self-pay | Admitting: Obstetrics and Gynecology

## 2023-07-16 DIAGNOSIS — Z1231 Encounter for screening mammogram for malignant neoplasm of breast: Secondary | ICD-10-CM

## 2023-08-26 ENCOUNTER — Ambulatory Visit: Payer: 59

## 2023-08-27 ENCOUNTER — Ambulatory Visit
Admission: RE | Admit: 2023-08-27 | Discharge: 2023-08-27 | Disposition: A | Discharge status: Home / Self Care | Payer: 59 | Source: Ambulatory Visit | Attending: Obstetrics and Gynecology | Admitting: Obstetrics and Gynecology

## 2023-08-27 DIAGNOSIS — Z1231 Encounter for screening mammogram for malignant neoplasm of breast: Secondary | ICD-10-CM

## 2024-08-24 ENCOUNTER — Other Ambulatory Visit: Payer: Self-pay | Admitting: Nurse Practitioner

## 2024-08-24 DIAGNOSIS — Z1231 Encounter for screening mammogram for malignant neoplasm of breast: Secondary | ICD-10-CM

## 2024-10-13 ENCOUNTER — Ambulatory Visit
